# Patient Record
Sex: Female | Born: 1980 | Hispanic: No | Marital: Married | Smoking: Former smoker
Health system: Southern US, Community
[De-identification: ages and names within clinical notes are randomized; demographics above are authoritative.]

## PROBLEM LIST (undated history)

## (undated) DIAGNOSIS — R87619 Unspecified abnormal cytological findings in specimens from cervix uteri: Secondary | ICD-10-CM

## (undated) DIAGNOSIS — A749 Chlamydial infection, unspecified: Secondary | ICD-10-CM

## (undated) DIAGNOSIS — IMO0002 Reserved for concepts with insufficient information to code with codable children: Secondary | ICD-10-CM

## (undated) HISTORY — PX: BREAST SURGERY: SHX581

## (undated) HISTORY — PX: INDUCED ABORTION: SHX677

---

## 2010-03-07 NOTE — L&D Delivery Note (Signed)
Delivery Note At 2:45 AM a viable female was delivered via Vaginal, Spontaneous Delivery (Presentation: ; Occiput Anterior).  APGAR: 8, 10; weight 6 lb 12.8 oz (3085 g).   Placenta status: Intact, Spontaneous.  Cord: 3 vessels with the following complications: None.    Anesthesia: Epidural  Episiotomy: None Lacerations: 2nd degree;Perineal Suture Repair: 2.0 3.0 vicryl rapide Est. Blood Loss (mL): 500  Mom to postpartum.  Baby to nursery-stable.  JACKSON-MOORE,Reymond Maynez A 10/03/2010, 3:14 AM

## 2010-06-13 ENCOUNTER — Inpatient Hospital Stay (HOSPITAL_COMMUNITY)
Admission: AD | Admit: 2010-06-13 | Discharge: 2010-06-13 | Disposition: A | Discharge status: Home / Self Care | Payer: BC Managed Care – HMO | Source: Ambulatory Visit | Attending: Obstetrics & Gynecology | Admitting: Obstetrics & Gynecology

## 2010-06-13 DIAGNOSIS — M549 Dorsalgia, unspecified: Secondary | ICD-10-CM

## 2010-06-13 DIAGNOSIS — O99891 Other specified diseases and conditions complicating pregnancy: Secondary | ICD-10-CM | POA: Insufficient documentation

## 2010-06-13 DIAGNOSIS — O9989 Other specified diseases and conditions complicating pregnancy, childbirth and the puerperium: Secondary | ICD-10-CM

## 2010-06-13 LAB — URINALYSIS, ROUTINE W REFLEX MICROSCOPIC
Bilirubin Urine: NEGATIVE
Ketones, ur: NEGATIVE mg/dL
Nitrite: NEGATIVE
Specific Gravity, Urine: 1.02 (ref 1.005–1.030)
Urobilinogen, UA: 0.2 mg/dL (ref 0.0–1.0)

## 2010-07-29 LAB — HIV ANTIBODY (ROUTINE TESTING W REFLEX)
HIV: NONREACTIVE
HIV: NONREACTIVE

## 2010-07-29 LAB — ABO/RH: RH Type: POSITIVE

## 2010-07-29 LAB — HEPATITIS B SURFACE ANTIGEN: Hepatitis B Surface Ag: NEGATIVE

## 2010-07-29 LAB — RPR
RPR: NONREACTIVE
RPR: NONREACTIVE

## 2010-07-29 LAB — TYPE AND SCREEN: Antibody Screen: NEGATIVE

## 2010-10-02 ENCOUNTER — Inpatient Hospital Stay (HOSPITAL_COMMUNITY): Payer: BC Managed Care – HMO | Admitting: Anesthesiology

## 2010-10-02 ENCOUNTER — Encounter (HOSPITAL_COMMUNITY): Payer: Self-pay | Admitting: Anesthesiology

## 2010-10-02 ENCOUNTER — Inpatient Hospital Stay (HOSPITAL_COMMUNITY)
Admission: AD | Admit: 2010-10-02 | Discharge: 2010-10-04 | DRG: 372 | Disposition: A | Discharge status: Home / Self Care | Payer: BC Managed Care – HMO | Source: Ambulatory Visit | Attending: Obstetrics & Gynecology | Admitting: Obstetrics & Gynecology

## 2010-10-02 ENCOUNTER — Encounter (HOSPITAL_COMMUNITY): Payer: Self-pay | Admitting: Obstetrics and Gynecology

## 2010-10-02 DIAGNOSIS — O139 Gestational [pregnancy-induced] hypertension without significant proteinuria, unspecified trimester: Secondary | ICD-10-CM | POA: Diagnosis not present

## 2010-10-02 DIAGNOSIS — O4290 Premature rupture of membranes, unspecified as to length of time between rupture and onset of labor, unspecified weeks of gestation: Secondary | ICD-10-CM | POA: Diagnosis present

## 2010-10-02 DIAGNOSIS — IMO0002 Reserved for concepts with insufficient information to code with codable children: Secondary | ICD-10-CM | POA: Diagnosis present

## 2010-10-02 DIAGNOSIS — O4292 Full-term premature rupture of membranes, unspecified as to length of time between rupture and onset of labor: Secondary | ICD-10-CM

## 2010-10-02 LAB — POCT FERN TEST: Fern Test: POSITIVE

## 2010-10-02 LAB — CBC
Hemoglobin: 11.5 g/dL — ABNORMAL LOW (ref 12.0–15.0)
MCH: 29 pg (ref 26.0–34.0)
Platelets: 277 10*3/uL (ref 150–400)
RBC: 3.96 MIL/uL (ref 3.87–5.11)
WBC: 8.9 10*3/uL (ref 4.0–10.5)

## 2010-10-02 LAB — RPR: RPR Ser Ql: NONREACTIVE

## 2010-10-02 MED ORDER — EPHEDRINE 5 MG/ML INJ
10.0000 mg | INTRAVENOUS | Status: DC | PRN
  Filled 2010-10-02: qty 4

## 2010-10-02 MED ORDER — DIPHENHYDRAMINE HCL 50 MG/ML IJ SOLN: 12.5000 mg | INTRAMUSCULAR | Status: DC | PRN

## 2010-10-02 MED ORDER — FLEET ENEMA 7-19 GM/118ML RE ENEM: 1.0000 | ENEMA | RECTAL | Status: DC | PRN

## 2010-10-02 MED ORDER — FENTANYL 2.5 MCG/ML BUPIVACAINE 1/10 % EPIDURAL INFUSION (WH - ANES)
14.0000 mL/h | INTRAMUSCULAR | Status: DC
  Administered 2010-10-02 – 2010-10-03 (×4): 14 mL/h via EPIDURAL
  Filled 2010-10-02 (×4): qty 60

## 2010-10-02 MED ORDER — OXYTOCIN 20 UNITS IN LACTATED RINGERS INFUSION - SIMPLE
1.0000 m[IU]/min | INTRAVENOUS | Status: DC
  Administered 2010-10-02: 2 m[IU]/min via INTRAVENOUS
  Filled 2010-10-02: qty 1000

## 2010-10-02 MED ORDER — LACTATED RINGERS IV SOLN
500.0000 mL | Freq: Once | INTRAVENOUS | Status: AC
  Administered 2010-10-02: 500 mL via INTRAVENOUS

## 2010-10-02 MED ORDER — OXYTOCIN 20 UNITS IN LACTATED RINGERS INFUSION - SIMPLE
125.0000 mL/h | Freq: Once | INTRAVENOUS | Status: DC
  Administered 2010-10-03: 999 mL/h via INTRAVENOUS

## 2010-10-02 MED ORDER — LACTATED RINGERS IV SOLN: 500.0000 mL | INTRAVENOUS | Status: DC | PRN

## 2010-10-02 MED ORDER — PHENYLEPHRINE 40 MCG/ML (10ML) SYRINGE FOR IV PUSH (FOR BLOOD PRESSURE SUPPORT)
80.0000 ug | PREFILLED_SYRINGE | INTRAVENOUS | Status: DC | PRN
  Filled 2010-10-02 (×2): qty 5

## 2010-10-02 MED ORDER — LORATADINE 10 MG PO TABS
10.0000 mg | ORAL_TABLET | Freq: Every day | ORAL | Status: DC
  Administered 2010-10-02: 10 mg via ORAL
  Filled 2010-10-02 (×2): qty 1

## 2010-10-02 MED ORDER — ACETAMINOPHEN 325 MG PO TABS: 650.0000 mg | ORAL_TABLET | ORAL | Status: DC | PRN

## 2010-10-02 MED ORDER — OXYCODONE-ACETAMINOPHEN 5-325 MG PO TABS: 2.0000 | ORAL_TABLET | ORAL | Status: DC | PRN

## 2010-10-02 MED ORDER — LIDOCAINE HCL (PF) 1 % IJ SOLN
30.0000 mL | INTRAMUSCULAR | Status: DC | PRN
  Administered 2010-10-03: 30 mL via SUBCUTANEOUS
  Filled 2010-10-02: qty 30

## 2010-10-02 MED ORDER — CITRIC ACID-SODIUM CITRATE 334-500 MG/5ML PO SOLN: 30.0000 mL | ORAL | Status: DC | PRN

## 2010-10-02 MED ORDER — IBUPROFEN 600 MG PO TABS: 600.0000 mg | ORAL_TABLET | Freq: Four times a day (QID) | ORAL | Status: DC | PRN

## 2010-10-02 MED ORDER — TERBUTALINE SULFATE 1 MG/ML IJ SOLN: 0.2500 mg | Freq: Once | INTRAMUSCULAR | Status: AC | PRN

## 2010-10-02 MED ORDER — ONDANSETRON HCL 4 MG/2ML IJ SOLN: 4.0000 mg | Freq: Four times a day (QID) | INTRAMUSCULAR | Status: DC | PRN

## 2010-10-02 MED ORDER — EPHEDRINE 5 MG/ML INJ
10.0000 mg | INTRAVENOUS | Status: DC | PRN
  Filled 2010-10-02 (×2): qty 4

## 2010-10-02 MED ORDER — NALBUPHINE HCL 10 MG/ML IJ SOLN
10.0000 mg | INTRAMUSCULAR | Status: DC | PRN
  Filled 2010-10-02: qty 1

## 2010-10-02 MED ORDER — PHENYLEPHRINE 40 MCG/ML (10ML) SYRINGE FOR IV PUSH (FOR BLOOD PRESSURE SUPPORT)
80.0000 ug | PREFILLED_SYRINGE | INTRAVENOUS | Status: DC | PRN
  Filled 2010-10-02: qty 5

## 2010-10-02 MED ORDER — LACTATED RINGERS IV SOLN
INTRAVENOUS | Status: DC
  Administered 2010-10-02 – 2010-10-03 (×3): via INTRAVENOUS

## 2010-10-02 MED ORDER — OXYTOCIN 20 UNITS IN LACTATED RINGERS INFUSION - SIMPLE
1.0000 m[IU]/min | INTRAVENOUS | Status: DC
  Filled 2010-10-02: qty 1000

## 2010-10-02 NOTE — Anesthesia Preprocedure Evaluation (Signed)
Anesthesia Evaluation  Name, MR# and DOB Patient awake  General Assessment Comment  Reviewed: Allergy & Precautions, H&P  and Patient's Chart, lab work & pertinent test results  Airway Mallampati: II TM Distance: >3 FB Neck ROM: full    Dental No notable dental hx    Pulmonaryneg pulmonary ROS    clear to auscultation  pulmonary exam normal   Cardiovascular regular Normal   Neuro/PsychNegative Neurological ROS Negative Psych ROS  GI/Hepatic/Renal negative GI ROS, negative Liver ROS, and negative Renal ROS (+)       Endo/Other  Negative Endocrine ROS (+)   Abdominal   Musculoskeletal  Hematology negative hematology ROS (+)   Peds  Reproductive/Obstetrics (+) Pregnancy   Anesthesia Other Findings             Anesthesia Physical Anesthesia Plan  ASA: II  Anesthesia Plan: Epidural   Post-op Pain Management:    Induction:   Airway Management Planned:   Additional Equipment:   Intra-op Plan:   Post-operative Plan:   Informed Consent: I have reviewed the patients History and Physical, chart, labs and discussed the procedure including the risks, benefits and alternatives for the proposed anesthesia with the patient or authorized representative who has indicated his/her understanding and acceptance.     Plan Discussed with:   Anesthesia Plan Comments:         Anesthesia Quick Evaluation  

## 2010-10-02 NOTE — ED Provider Notes (Signed)
History     Chief Complaint  Patient presents with  . Vaginal Discharge    "I think my water broke"   HPI Called to do speculum exam to R/O rupture of membranes. Also has mild contractions. +fetal movement.   No past medical history on file.  Past Surgical History  Procedure Date  . Breast surgery 3 -4 yrs ago    augmentation to increase size    No family history on file.  History  Substance Use Topics  . Smoking status: Never Smoker   . Smokeless tobacco: Not on file  . Alcohol Use: No    Allergies:  Allergies  Allergen Reactions  . Latex Itching and Swelling    Prescriptions prior to admission  Medication Sig Dispense Refill  . cetirizine (ZYRTEC) 10 MG tablet Take 10 mg by mouth daily.        . prenatal vitamin w/FE, FA (PRENATAL 1 + 1) 27-1 MG TABS Take 1 tablet by mouth daily.        . ranitidine (ZANTAC) 150 MG tablet Take 150 mg by mouth 3 (three) times daily as needed.          Review of Systems  Gastrointestinal: Negative for abdominal pain.  Genitourinary:       Watery discharge    Physical Exam   Blood pressure 134/89, pulse 88, temperature 98.6 F (37 C), temperature source Oral, resp. rate 20, height 5\' 3"  (1.6 m), weight 181 lb (82.101 kg).  Physical Exam  Constitutional: She is oriented to person, place, and time. She appears well-developed and well-nourished.  HENT:  Head: Normocephalic.  Respiratory: Effort normal.  GI: Soft. There is no tenderness.  Genitourinary: Uterus normal. Vaginal discharge (Speculum: copious pooling clear fluid. Cervix 1-2/80/-3/vtx) found.  Musculoskeletal: Normal range of motion.  Neurological: She is alert and oriented to person, place, and time.  Skin: Skin is warm and dry.  Psychiatric: She has a normal mood and affect.    MAU Course  Procedures  MDM   Assessment and Plan  IUP at term PROM at term Admit per Dr Raynelle Jan 10/02/2010, 7:01 AM

## 2010-10-02 NOTE — Progress Notes (Signed)
"  I woke up wet, because I thought I had to use the BR at 0440.  I have continued to leak clear fluid since then and now I'm bleeding."

## 2010-10-02 NOTE — H&P (Signed)
Wendy Austin is a 30 y.o. female presenting for SROM. Maternal Medical History:  Reason for admission: Reason for admission: rupture of membranes.  Reason for Admission:   nauseaFetal activity: Perceived fetal activity is normal.   Last perceived fetal movement was within the past hour.    Prenatal complications: no prenatal complications   OB History    Grav Para Term Preterm Abortions TAB SAB Ect Mult Living   2 0 0 0 1 1 0 0 0 0      History reviewed. No pertinent past medical history. Past Surgical History  Procedure Date  . Breast surgery 3 -4 yrs ago    augmentation to increase size   Family History: family history is not on file. Social History:  reports that she has never smoked. She does not have any smokeless tobacco history on file. She reports that she does not drink alcohol or use illicit drugs.  Review of Systems  Constitutional: Negative for fever.  Eyes: Negative for blurred vision.  Respiratory: Negative for shortness of breath.   Gastrointestinal: Negative for nausea and vomiting.  Skin: Negative for rash.  Neurological: Negative for headaches.    Dilation: 2.5 Effacement (%): 80 Station: -2 Exam by:: k fields, rn Blood pressure 145/93, pulse 82, temperature 98.5 F (36.9 C), temperature source Oral, resp. rate 18, height 5\' 3"  (1.6 m), weight 82.101 kg (181 lb), last menstrual period 01/11/2010, unknown if currently breastfeeding. Maternal Exam:  Uterine Assessment: Contraction strength is mild.  Contraction frequency is irregular.   Abdomen: Patient reports no abdominal tenderness. Fetal presentation: vertex  Introitus: not evaluated.     Fetal Exam Fetal Monitor Review: Variability: moderate (6-25 bpm).   Pattern: accelerations present and no decelerations.    Fetal State Assessment: Category I - tracings are normal.     Physical Exam  Constitutional: She appears well-developed.  HENT:  Head: Normocephalic.  Neck: Neck supple. No  thyromegaly present.  Cardiovascular: Normal rate and regular rhythm.   Respiratory: Breath sounds normal.  GI: Soft. Bowel sounds are normal.  Skin: No rash noted.    Prenatal labs: ABO, Rh:   Antibody: Negative (05/24 0000) Rubella:   RPR: Nonreactive, Nonreactive, Nonreactive (05/24 0000)  HBsAg: Negative (05/24 0000)  HIV: Non-reactive, Non-reactive, Non-reactive (05/24 0000)  GBS: Negative (07/19 0000)   Assessment/Plan: 30 y.o. @ [redacted]w[redacted]d SROM, prodromal labor.  Admit.  Possible augmentation of labor with Pitocin.  Anticipate and NSVD.  JACKSON-MOORE,Doak Mah A 10/02/2010, 2:24 PM

## 2010-10-02 NOTE — Anesthesia Procedure Notes (Signed)
Epidural Patient location during procedure: OB Start time: 10/02/2010 2:23 PM  Staffing Anesthesiologist: Brayton Caves R Performed by: anesthesiologist   Preanesthetic Checklist Completed: patient identified, site marked, surgical consent, pre-op evaluation, timeout performed, IV checked, risks and benefits discussed and monitors and equipment checked  Epidural Patient position: sitting Prep: site prepped and draped and DuraPrep Patient monitoring: continuous pulse ox and blood pressure Approach: midline Injection technique: LOR saline  Needle:  Needle type: Tuohy  Needle gauge: 17 G Needle length: 9 cm Needle insertion depth: 5 cm cm Catheter type: closed end flexible Catheter size: 19 Gauge Catheter at skin depth: 10 cm Test dose: negative  Assessment Events: blood not aspirated, injection not painful, no injection resistance, negative IV test and no paresthesia  Additional Notes Patient identified.  Risk benefits discussed including failed block, incomplete pain control, headache, nerve damage, paralysis, blood pressure changes, nausea, vomiting, reactions to medication both toxic or allergic, and postpartum back pain.  Patient expressed understanding and wished to proceed.  All questions were answered.  Sterile technique used throughout procedure and epidural site dressed with sterile barrier dressing. No paresthesia or other complications noted. 5 minutes prior to starting epidural infusion of fentanyl and bupivicaine, the epidural was loaded with 9 ml of 1.5% lidocaine in three separate doses.  The patient did not experience any signs of intravascular injection such as tinnitus or metallic taste in mouth nor signs of intrathecal spread such as rapid motor block. Please see nursing notes for vital signs.

## 2010-10-02 NOTE — Progress Notes (Signed)
Glendola Friedhoff is a 30 y.o. G2P0010 at [redacted]w[redacted]d by LMP admitted for rupture of membranes  Subjective:   Objective: BP 134/83  Pulse 89  Temp(Src) 98.7 F (37.1 C) (Oral)  Resp 18  Ht 5\' 3"  (1.6 m)  Wt 82.101 kg (181 lb)  BMI 32.06 kg/m2  SpO2 100%  LMP 01/11/2010  Breastfeeding? Unknown      FHT:  FHR: 140 bpm, variability: moderate,  accelerations:  Present,  decelerations:  Absent UC:   regular, every 2 minutes SVE:   Dilation: 7.5 Effacement (%): 80 Station: 0 Exam by:: Ferne Coe RN  Labs: Lab Results  Component Value Date   WBC 8.9 10/02/2010   HGB 11.5* 10/02/2010   HCT 34.3* 10/02/2010   MCV 86.6 10/02/2010   PLT 277 10/02/2010    Assessment / Plan: Augmentation of labor, progressing well  Labor: Progressing normally Preeclampsia:  check labs Fetal Wellbeing:  Category I Pain Control:  Epidural I/D:  n/a Anticipated MOD:  NSVD  JACKSON-MOORE,Chauntae Hults A 10/02/2010, 11:41 PM

## 2010-10-03 ENCOUNTER — Encounter (HOSPITAL_COMMUNITY): Payer: Self-pay | Admitting: *Deleted

## 2010-10-03 LAB — LACTATE DEHYDROGENASE: LDH: 216 U/L (ref 94–250)

## 2010-10-03 LAB — URIC ACID: Uric Acid, Serum: 6.5 mg/dL (ref 2.4–7.0)

## 2010-10-03 LAB — CBC
HCT: 32.1 % — ABNORMAL LOW (ref 36.0–46.0)
Hemoglobin: 10.7 g/dL — ABNORMAL LOW (ref 12.0–15.0)
MCH: 28.8 pg (ref 26.0–34.0)
MCHC: 33.3 g/dL (ref 30.0–36.0)
MCV: 86.3 fL (ref 78.0–100.0)
Platelets: 264 10*3/uL (ref 150–400)
RBC: 3.72 MIL/uL — ABNORMAL LOW (ref 3.87–5.11)
RDW: 13.6 % (ref 11.5–15.5)
WBC: 21.6 10*3/uL — ABNORMAL HIGH (ref 4.0–10.5)

## 2010-10-03 LAB — COMPREHENSIVE METABOLIC PANEL
ALT: 8 U/L (ref 0–35)
AST: 18 U/L (ref 0–37)
Albumin: 2.2 g/dL — ABNORMAL LOW (ref 3.5–5.2)
Alkaline Phosphatase: 124 U/L — ABNORMAL HIGH (ref 39–117)
BUN: 9 mg/dL (ref 6–23)
CO2: 21 mEq/L (ref 19–32)
Calcium: 8.9 mg/dL (ref 8.4–10.5)
Chloride: 102 mEq/L (ref 96–112)
Creatinine, Ser: 0.7 mg/dL (ref 0.50–1.10)
GFR calc Af Amer: >60 mL/min (ref >60)
GFR calc non Af Amer: >60 mL/min (ref >60)
Glucose, Bld: 164 mg/dL — ABNORMAL HIGH (ref 70–99)
Potassium: 3.6 mEq/L (ref 3.5–5.1)
Sodium: 133 mEq/L — ABNORMAL LOW (ref 135–145)
Total Bilirubin: 0.5 mg/dL (ref 0.3–1.2)
Total Protein: 5.4 g/dL — ABNORMAL LOW (ref 6.0–8.3)

## 2010-10-03 MED ORDER — IBUPROFEN 600 MG PO TABS
600.0000 mg | ORAL_TABLET | Freq: Four times a day (QID) | ORAL | Status: DC
  Administered 2010-10-03 – 2010-10-04 (×5): 600 mg via ORAL
  Filled 2010-10-03 (×7): qty 1

## 2010-10-03 MED ORDER — PRENATAL PLUS 27-1 MG PO TABS
1.0000 | ORAL_TABLET | Freq: Every day | ORAL | Status: DC
  Administered 2010-10-03 – 2010-10-04 (×2): 1 via ORAL
  Filled 2010-10-03 (×2): qty 1

## 2010-10-03 MED ORDER — BENZOCAINE-MENTHOL 20-0.5 % EX AERO: 1.0000 "application " | INHALATION_SPRAY | CUTANEOUS | Status: DC | PRN

## 2010-10-03 MED ORDER — MAGNESIUM HYDROXIDE 400 MG/5ML PO SUSP: 30.0000 mL | ORAL | Status: DC | PRN

## 2010-10-03 MED ORDER — SODIUM CHLORIDE 0.9 % IJ SOLN: 3.0000 mL | INTRAMUSCULAR | Status: DC | PRN

## 2010-10-03 MED ORDER — SODIUM CHLORIDE 0.9 % IJ SOLN: 3.0000 mL | Freq: Two times a day (BID) | INTRAMUSCULAR | Status: DC

## 2010-10-03 MED ORDER — ONDANSETRON HCL 4 MG/2ML IJ SOLN: 4.0000 mg | INTRAMUSCULAR | Status: DC | PRN

## 2010-10-03 MED ORDER — TETANUS-DIPHTH-ACELL PERTUSSIS 5-2.5-18.5 LF-MCG/0.5 IM SUSP: 0.5000 mL | Freq: Once | INTRAMUSCULAR | Status: DC

## 2010-10-03 MED ORDER — ZOLPIDEM TARTRATE 5 MG PO TABS: 5.0000 mg | ORAL_TABLET | Freq: Every evening | ORAL | Status: DC | PRN

## 2010-10-03 MED ORDER — OXYCODONE-ACETAMINOPHEN 5-325 MG PO TABS
1.0000 | ORAL_TABLET | ORAL | Status: DC | PRN
  Administered 2010-10-04: 1 via ORAL
  Filled 2010-10-03: qty 1

## 2010-10-03 MED ORDER — ONDANSETRON HCL 4 MG PO TABS: 4.0000 mg | ORAL_TABLET | ORAL | Status: DC | PRN

## 2010-10-03 MED ORDER — MEASLES, MUMPS & RUBELLA VAC ~~LOC~~ INJ
0.5000 mL | INJECTION | Freq: Once | SUBCUTANEOUS | Status: DC
  Filled 2010-10-03: qty 0.5

## 2010-10-03 MED ORDER — FERROUS SULFATE 325 (65 FE) MG PO TABS
325.0000 mg | ORAL_TABLET | Freq: Two times a day (BID) | ORAL | Status: DC
  Administered 2010-10-03 – 2010-10-04 (×3): 325 mg via ORAL
  Filled 2010-10-03 (×3): qty 1

## 2010-10-03 MED ORDER — WITCH HAZEL-GLYCERIN EX PADS: MEDICATED_PAD | CUTANEOUS | Status: DC | PRN

## 2010-10-03 MED ORDER — MEDROXYPROGESTERONE ACETATE 150 MG/ML IM SUSP: 150.0000 mg | INTRAMUSCULAR | Status: DC | PRN

## 2010-10-03 MED ORDER — OXYTOCIN 10 UNIT/ML IJ SOLN
INTRAMUSCULAR | Status: AC
  Filled 2010-10-03: qty 2

## 2010-10-03 MED ORDER — LACTATED RINGERS IV SOLN
40.0000 [IU] | INTRAVENOUS | Status: DC
  Administered 2010-10-03: 40 [IU] via INTRAVENOUS
  Filled 2010-10-03: qty 4

## 2010-10-03 MED ORDER — SENNOSIDES-DOCUSATE SODIUM 8.6-50 MG PO TABS: 1.0000 | ORAL_TABLET | Freq: Every day | ORAL | Status: DC

## 2010-10-03 MED ORDER — OXYTOCIN 20 UNITS IN LACTATED RINGERS INFUSION - SIMPLE: 125.0000 mL/h | INTRAVENOUS | Status: DC | PRN

## 2010-10-03 MED ORDER — SODIUM CHLORIDE 0.9 % IV SOLN: 250.0000 mL | INTRAVENOUS | Status: DC

## 2010-10-03 MED ORDER — BENZOCAINE-MENTHOL 20-0.5 % EX AERO
INHALATION_SPRAY | CUTANEOUS | Status: AC
  Filled 2010-10-03: qty 56

## 2010-10-03 MED ORDER — LANOLIN HYDROUS EX OINT: TOPICAL_OINTMENT | CUTANEOUS | Status: DC | PRN

## 2010-10-03 NOTE — Anesthesia Postprocedure Evaluation (Signed)
  Anesthesia Post-op Note  Patient: Wendy Austin  Patient's cardiopulmonary status is stable Patient's level of consciousness: sedate but responsive verbally Pain and nausea are all reasonably controlled No anesthetic complications apparent at this time No follow up care necessary at this time

## 2010-10-03 NOTE — Progress Notes (Signed)
  Post Partum Day 0 S/P spontaneous vaginal RH status/Rubella reviewed.  Feeding: breast Subjective: No HA, SOB, CP, F/C, breast symptoms. Normal vaginal bleeding, no clots.     Objective: BP 135/95  Pulse 91  Temp(Src) 98.3 F (36.8 C) (Oral)  Resp 20  Ht 5\' 3"  (1.6 m)  Wt 82.101 kg (181 lb)  BMI 32.06 kg/m2  SpO2 100%  LMP 01/11/2010  Breastfeeding? Unknown   Physical Exam:  General: alert Lochia: appropriate Uterine Fundus: firm DVT Evaluation: No evidence of DVT seen on physical exam. Ext: No c/c/e  Basename 10/03/10 0520 10/02/10 0745  HGB 10.7* 11.5*  HCT 32.1* 34.3*      Assessment/Plan: 30 y.o.  PPD #0 .  normal postpartum exam PIH stable Continue current postpartum care  Ambulate   LOS: 1 day   JACKSON-MOORE,Rosie Torrez A 10/03/2010, 9:29 AM

## 2010-10-03 NOTE — Progress Notes (Signed)
Basic reviewed and mother states infant latching well. Offered to observe feeding and mother declined at this time.states she took private breastfeeding classes and that she is ok . inst to call if assistance is needed.

## 2010-10-04 MED ORDER — IBUPROFEN 600 MG PO TABS: 600.0000 mg | ORAL_TABLET | Freq: Four times a day (QID) | ORAL | Status: DC

## 2010-10-04 NOTE — Addendum Note (Signed)
Addendum  created 10/04/10 1121 by Randa Spike   Modules edited:Charges VN, Notes Section

## 2010-10-04 NOTE — Progress Notes (Signed)
  Post Partum Day 1 Subjective: no complaints, up ad lib, voiding and tolerating PO  Objective: Blood pressure 92/60, pulse 76, temperature 98.3 F (36.8 C), temperature source Oral, resp. rate 18, height 5\' 3"  (1.6 m), weight 82.101 kg (181 lb), last menstrual period 01/11/2010, SpO2 100.00%, unknown if currently breastfeeding.  Physical Exam:  General: alert and no distress Lochia: appropriate Uterine Fundus: firm Incision: n/a DVT Evaluation: No evidence of DVT seen on physical exam.   Basename 10/03/10 0520 10/02/10 0745  HGB 10.7* 11.5*  HCT 32.1* 34.3*    Assessment/Plan: Discharge home   LOS: 2 days   HARPER,CHARLES A 10/04/2010, 9:09 AM

## 2010-10-04 NOTE — Discharge Summary (Signed)
Obstetric Discharge Summary Reason for Admission: onset of labor Prenatal Procedures: ultrasound Intrapartum Procedures: spontaneous vaginal delivery Postpartum Procedures: none Complications-Operative and Postpartum: none Hemoglobin  Date Value Range Status  10/03/2010 10.7* 12.0-15.0 (g/dL) Final     HCT  Date Value Range Status  10/03/2010 32.1* 36.0-46.0 (%) Final    Discharge Diagnoses: Term Pregnancy-delivered  Discharge Information: Date: 10/04/2010 Activity: unrestricted Diet: routine Medications: PNV and Ibuprophen Condition: stable Instructions: refer to practice specific booklet Discharge to: home Follow-up Information    Follow up with Roseanna Rainbow, MD.   Contact information:   [address], [address] [place] [address] [phone]          Newborn Data: Live born female  Birth Weight: 6 lb 12.8 oz (3085 g) APGAR: 8, 10  Home with mother.  Salma Walrond A 10/04/2010, 9:11 AM

## 2010-10-04 NOTE — Addendum Note (Signed)
Addendum  created 10/04/10 1121 by Ruth Tully D Tanashia Ciesla   Modules edited:Charges VN, Notes Section    

## 2010-10-04 NOTE — Anesthesia Postprocedure Evaluation (Signed)
  Anesthesia Post-op Note  Patient: Wendy Austin  Procedure(s) Performed: * No procedures listed *  Patient Location: 145  Anesthesia Type: Epidural  Level of Consciousness: awake  Airway and Oxygen Therapy: Patient Spontanous Breathing    Post-op Assessment: Post-op Vital signs reviewed  Post-op Vital Signs: Reviewed  Complications: No apparent anesthesia complications

## 2010-10-04 NOTE — Progress Notes (Signed)
Charted on wrong pt- pt remains asleep/ no pain

## 2010-10-04 NOTE — Progress Notes (Signed)
Mother was discharged before seeing.

## 2011-08-22 ENCOUNTER — Other Ambulatory Visit: Payer: Self-pay | Admitting: Family Medicine

## 2011-08-22 DIAGNOSIS — E0590 Thyrotoxicosis, unspecified without thyrotoxic crisis or storm: Secondary | ICD-10-CM

## 2011-08-25 ENCOUNTER — Ambulatory Visit
Admission: RE | Admit: 2011-08-25 | Discharge: 2011-08-25 | Disposition: A | Discharge status: Home / Self Care | Payer: Medicaid Other | Source: Ambulatory Visit | Attending: Family Medicine | Admitting: Family Medicine

## 2011-08-25 DIAGNOSIS — E0590 Thyrotoxicosis, unspecified without thyrotoxic crisis or storm: Secondary | ICD-10-CM

## 2011-08-26 ENCOUNTER — Other Ambulatory Visit: Payer: BC Managed Care – HMO

## 2011-09-20 ENCOUNTER — Encounter (HOSPITAL_COMMUNITY): Payer: Self-pay | Admitting: *Deleted

## 2011-09-20 ENCOUNTER — Inpatient Hospital Stay (HOSPITAL_COMMUNITY): Payer: Medicaid Other

## 2011-09-20 ENCOUNTER — Inpatient Hospital Stay (HOSPITAL_COMMUNITY)
Admission: AD | Admit: 2011-09-20 | Discharge: 2011-09-20 | Disposition: A | Discharge status: Home / Self Care | Payer: Medicaid Other | Source: Ambulatory Visit | Attending: Obstetrics & Gynecology | Admitting: Obstetrics & Gynecology

## 2011-09-20 DIAGNOSIS — B9689 Other specified bacterial agents as the cause of diseases classified elsewhere: Secondary | ICD-10-CM | POA: Insufficient documentation

## 2011-09-20 DIAGNOSIS — M545 Low back pain, unspecified: Secondary | ICD-10-CM | POA: Insufficient documentation

## 2011-09-20 DIAGNOSIS — N760 Acute vaginitis: Secondary | ICD-10-CM | POA: Insufficient documentation

## 2011-09-20 DIAGNOSIS — O2390 Unspecified genitourinary tract infection in pregnancy, unspecified trimester: Secondary | ICD-10-CM | POA: Insufficient documentation

## 2011-09-20 DIAGNOSIS — A499 Bacterial infection, unspecified: Secondary | ICD-10-CM | POA: Insufficient documentation

## 2011-09-20 DIAGNOSIS — R109 Unspecified abdominal pain: Secondary | ICD-10-CM | POA: Insufficient documentation

## 2011-09-20 HISTORY — DX: Reserved for concepts with insufficient information to code with codable children: IMO0002

## 2011-09-20 HISTORY — DX: Chlamydial infection, unspecified: A74.9

## 2011-09-20 HISTORY — DX: Unspecified abnormal cytological findings in specimens from cervix uteri: R87.619

## 2011-09-20 LAB — CBC
HCT: 37.8 % (ref 36.0–46.0)
Hemoglobin: 12.9 g/dL (ref 12.0–15.0)
MCH: 29.9 pg (ref 26.0–34.0)
MCV: 87.5 fL (ref 78.0–100.0)
Platelets: 320 10*3/uL (ref 150–400)
RBC: 4.32 MIL/uL (ref 3.87–5.11)
WBC: 11.4 10*3/uL — ABNORMAL HIGH (ref 4.0–10.5)

## 2011-09-20 LAB — URINALYSIS, ROUTINE W REFLEX MICROSCOPIC
Bilirubin Urine: NEGATIVE
Glucose, UA: NEGATIVE mg/dL
Hgb urine dipstick: NEGATIVE
Ketones, ur: NEGATIVE mg/dL
Protein, ur: 30 mg/dL — AB
Urobilinogen, UA: 0.2 mg/dL (ref 0.0–1.0)

## 2011-09-20 LAB — POCT PREGNANCY, URINE: Preg Test, Ur: POSITIVE — AB

## 2011-09-20 LAB — WET PREP, GENITAL: Trich, Wet Prep: NONE SEEN

## 2011-09-20 LAB — URINE MICROSCOPIC-ADD ON

## 2011-09-20 MED ORDER — METRONIDAZOLE 500 MG PO TABS: 500.0000 mg | ORAL_TABLET | Freq: Two times a day (BID) | ORAL | Status: AC

## 2011-09-20 NOTE — MAU Note (Signed)
Patient states she has been having left low back pain and lower abdominal pain off and on for about 4 days. Has a positive pregnancy test at her primary MD. Denies any vomiting, some nausea. No bleeding but heavier white discharge.

## 2011-09-20 NOTE — MAU Provider Note (Signed)
  History     CSN: [id number]  Arrival date and time: 09/20/11 1533   None     Chief Complaint  Patient presents with  . Back Pain   HPI  Patient states she has been having left low back pain and lower abdominal pain off and on for about 4 days. Has a positive pregnancy test at her primary MD. Denies any vomiting, some nausea. No bleeding but heavier white discharge, no odor.     Past Medical History  Diagnosis Date  . Abnormal Pap smear     ok since  . Chlamydia     Past Surgical History  Procedure Date  . Breast surgery 3 -4 yrs ago    augmentation to increase size  . Induced abortion     Family History  Problem Relation Age of Onset  . Other Neg Hx     History  Substance Use Topics  . Smoking status: Former Smoker    Types: Cigarettes  . Smokeless tobacco: Not on file   Comment: 2009  . Alcohol Use: No    Allergies:  Allergies  Allergen Reactions  . Latex Itching and Swelling    Prescriptions prior to admission  Medication Sig Dispense Refill  . cetirizine (ZYRTEC) 10 MG tablet Take 10 mg by mouth daily.        Marland Kitchen ibuprofen (ADVIL,MOTRIN) 600 MG tablet Take 1 tablet (600 mg total) by mouth every 6 (six) hours.  30 tablet  5  . prenatal vitamin w/FE, FA (PRENATAL 1 + 1) 27-1 MG TABS Take 1 tablet by mouth daily.        . ranitidine (ZANTAC) 150 MG tablet Take 150 mg by mouth 3 (three) times daily as needed.          Review of Systems  Gastrointestinal: Positive for abdominal pain.  Genitourinary:       No vaginal bleeding  All other systems reviewed and are negative.   Physical Exam   Blood pressure 103/65, pulse 74, temperature 98.2 F (36.8 C), temperature source Oral, resp. rate 18, height 5\' 4"  (1.626 m), weight 55.52 kg (122 lb 6.4 oz), last menstrual period 07/25/2011, SpO2 100.00%, unknown if currently breastfeeding.  Physical Exam  Constitutional: She is oriented to person, place, and time. She appears well-developed and well-nourished.  No distress.  HENT:  Head: Normocephalic.  Neck: Normal range of motion. Neck supple.  Cardiovascular: Normal rate, regular rhythm and normal heart sounds.  Exam reveals no gallop and no friction rub.   No murmur heard. Respiratory: Effort normal and breath sounds normal. No respiratory distress.  GI: She exhibits no mass. There is no tenderness. There is no rebound, no guarding and no CVA tenderness.  Genitourinary: Uterus is enlarged. Cervix exhibits no motion tenderness and no discharge. Vaginal discharge (white, creamy) found.  Musculoskeletal: Normal range of motion.  Neurological: She is alert and oriented to person, place, and time.  Skin: Skin is warm and dry.  Psychiatric: She has a normal mood and affect.    MAU Course  Procedures   BHCG 40946 Wet prep - moderate clue GC/CT neg x2 Blood type O+ Hgb 12.9 Assessment and Plan  Bacterial Vaginosis  DC to home RX flagyl Begin prenatal care  [facility] 09/20/2011, 6:16 PM

## 2011-09-20 NOTE — MAU Note (Signed)
Negative CVA tenderness.

## 2011-09-20 NOTE — MAU Note (Signed)
Pain in low back on left side, comes and goes, getting worse.  Dull pain below umbilicus.

## 2011-09-21 LAB — GC/CHLAMYDIA PROBE AMP, GENITAL
Chlamydia, DNA Probe: NEGATIVE
GC Probe Amp, Genital: NEGATIVE

## 2014-01-06 ENCOUNTER — Encounter (HOSPITAL_COMMUNITY): Payer: Self-pay | Admitting: *Deleted

## 2015-11-04 ENCOUNTER — Emergency Department (HOSPITAL_COMMUNITY)
Admission: EM | Admit: 2015-11-04 | Discharge: 2015-11-05 | Disposition: A | Discharge status: Home / Self Care | Payer: BLUE CROSS/BLUE SHIELD | Attending: Emergency Medicine | Admitting: Emergency Medicine

## 2015-11-04 ENCOUNTER — Emergency Department (HOSPITAL_COMMUNITY): Payer: BLUE CROSS/BLUE SHIELD

## 2015-11-04 ENCOUNTER — Encounter (HOSPITAL_COMMUNITY): Payer: Self-pay

## 2015-11-04 DIAGNOSIS — R1031 Right lower quadrant pain: Secondary | ICD-10-CM | POA: Diagnosis not present

## 2015-11-04 DIAGNOSIS — R110 Nausea: Secondary | ICD-10-CM | POA: Diagnosis not present

## 2015-11-04 DIAGNOSIS — Z87891 Personal history of nicotine dependence: Secondary | ICD-10-CM | POA: Diagnosis not present

## 2015-11-04 LAB — COMPREHENSIVE METABOLIC PANEL
ALT: 21 U/L (ref 14–54)
AST: 22 U/L (ref 15–41)
Albumin: 4.3 g/dL (ref 3.5–5.0)
Alkaline Phosphatase: 46 U/L (ref 38–126)
Anion gap: 6 (ref 5–15)
BUN: 10 mg/dL (ref 6–20)
CO2: 22 mmol/L (ref 22–32)
Calcium: 8.9 mg/dL (ref 8.9–10.3)
Chloride: 106 mmol/L (ref 101–111)
Creatinine, Ser: 0.78 mg/dL (ref 0.44–1.00)
GFR calc Af Amer: >60 mL/min (ref >60)
GFR calc non Af Amer: >60 mL/min (ref >60)
Glucose, Bld: 109 mg/dL — ABNORMAL HIGH (ref 65–99)
Potassium: 3.2 mmol/L — ABNORMAL LOW (ref 3.5–5.1)
Sodium: 134 mmol/L — ABNORMAL LOW (ref 135–145)
Total Bilirubin: 0.6 mg/dL (ref 0.3–1.2)
Total Protein: 8 g/dL (ref 6.5–8.1)

## 2015-11-04 LAB — CBC
HCT: 43.3 % (ref 36.0–46.0)
Hemoglobin: 15.4 g/dL — ABNORMAL HIGH (ref 12.0–15.0)
MCH: 32 pg (ref 26.0–34.0)
MCHC: 35.6 g/dL (ref 30.0–36.0)
MCV: 89.8 fL (ref 78.0–100.0)
Platelets: 299 10*3/uL (ref 150–400)
RBC: 4.82 MIL/uL (ref 3.87–5.11)
RDW: 12.4 % (ref 11.5–15.5)
WBC: 6.6 10*3/uL (ref 4.0–10.5)

## 2015-11-04 LAB — URINE MICROSCOPIC-ADD ON: RBC / HPF: NONE SEEN RBC/hpf (ref 0–5)

## 2015-11-04 LAB — URINALYSIS, ROUTINE W REFLEX MICROSCOPIC
Bilirubin Urine: NEGATIVE
GLUCOSE, UA: NEGATIVE mg/dL
Ketones, ur: NEGATIVE mg/dL
Nitrite: NEGATIVE
PROTEIN: NEGATIVE mg/dL
Specific Gravity, Urine: 1.005 (ref 1.005–1.030)
pH: 6 (ref 5.0–8.0)

## 2015-11-04 LAB — POC URINE PREG, ED: PREG TEST UR: NEGATIVE

## 2015-11-04 LAB — LIPASE, BLOOD: LIPASE: 37 U/L (ref 11–51)

## 2015-11-04 MED ORDER — MORPHINE SULFATE (PF) 4 MG/ML IV SOLN
4.0000 mg | Freq: Once | INTRAVENOUS | Status: AC
  Administered 2015-11-04: 4 mg via INTRAVENOUS
  Filled 2015-11-04: qty 1

## 2015-11-04 MED ORDER — IOPAMIDOL (ISOVUE-300) INJECTION 61%
100.0000 mL | Freq: Once | INTRAVENOUS | Status: AC | PRN
  Administered 2015-11-04: 100 mL via INTRAVENOUS

## 2015-11-04 MED ORDER — SODIUM CHLORIDE 0.9 % IV BOLUS (SEPSIS)
500.0000 mL | Freq: Once | INTRAVENOUS | Status: AC
  Administered 2015-11-04: 500 mL via INTRAVENOUS

## 2015-11-04 MED ORDER — ONDANSETRON 4 MG PO TBDP
4.0000 mg | ORAL_TABLET | Freq: Once | ORAL | Status: AC | PRN
  Administered 2015-11-04: 4 mg via ORAL
  Filled 2015-11-04: qty 1

## 2015-11-04 MED ORDER — ONDANSETRON HCL 4 MG/2ML IJ SOLN
4.0000 mg | Freq: Once | INTRAMUSCULAR | Status: AC
  Administered 2015-11-04: 4 mg via INTRAVENOUS
  Filled 2015-11-04: qty 2

## 2015-11-04 MED ORDER — DIATRIZOATE MEGLUMINE & SODIUM 66-10 % PO SOLN: 15.0000 mL | Freq: Once | ORAL | Status: DC

## 2015-11-04 MED ORDER — ACETAMINOPHEN 325 MG PO TABS
650.0000 mg | ORAL_TABLET | Freq: Once | ORAL | Status: AC | PRN
  Administered 2015-11-04: 650 mg via ORAL
  Filled 2015-11-04: qty 2

## 2015-11-04 NOTE — ED Triage Notes (Signed)
Pt states that she started having RLQ abdominal pain yesterday and started experiencing fever and chills today. Pt also states that she is experiencing pain in her neck. Pt is able to touch her chin to her chest without difficulty. Pt also endorses nausea without vomiting. A&Ox4. Ambulatory.

## 2015-11-04 NOTE — Progress Notes (Signed)
Patient listed as having [employer] insurance without a pcp.  EDCM spoke to patient at bedside.  Patient confirms she has [employer] insurance.  She does not have Medicaid insurance.  Patient reports she has made an appointment to see Dr. Leilani AbleBetti Reese for tomorrow to establish care.  No further EDCM needs at this time.

## 2015-11-04 NOTE — ED Provider Notes (Signed)
[facility]-EMERGENCY DEPT Provider Note   CSN: [id number] Arrival date & time: 11/04/15  1937     History   Chief Complaint Chief Complaint  Patient presents with  . Nausea  . Abdominal Pain    HPI Wendy Austin is a 35 y.o. female.  The history is provided by the patient.  Abdominal Pain   This is a new problem. Associated symptoms include nausea and dysuria. Pertinent negatives include diarrhea, vomiting and headaches.  Patient presents with abdominal pain. Began yesterday somewhat diffusely and is now more in the lower abdomen. Has had fevers. Has had dysuria. Has now pain in her right lower abdomen and right flank. No vaginal bleeding or discharge. Seen in urgent care and sent here. She's had a decreased appetite. Has had nausea without vomiting. No diarrhea. The pain is dull. Worse with movements. No sick contacts.  Past Medical History:  Diagnosis Date  . Abnormal Pap smear    ok since  . Chlamydia     Patient Active Problem List   Diagnosis Date Noted  . Normal delivery 10/03/2010  . Spontaneous rupture of membranes 10/02/2010  . Gestational hypertension 10/02/2010    Past Surgical History:  Procedure Laterality Date  . BREAST SURGERY  3 -4 yrs ago   augmentation to increase size  . INDUCED ABORTION      OB History    Gravida Para Term Preterm AB Living   3 1 1  0 1 1   SAB TAB Ectopic Multiple Live Births   0 1 0 0 1       Home Medications    Prior to Admission medications   Medication Sig Start Date End Date Taking? Authorizing Provider  acetaminophen (TYLENOL) 500 MG tablet Take 1,000 mg by mouth every 6 (six) hours as needed for moderate pain.   Yes Historical Provider, MD  cetirizine (ZYRTEC) 10 MG tablet Take 10 mg by mouth daily.     Yes Historical Provider, MD  ondansetron (ZOFRAN-ODT) 8 MG disintegrating tablet Take 1 tablet (8 mg total) by mouth every 8 (eight) hours as needed for nausea or vomiting. 11/05/15   Benjiman Core, MD    Family  History Family History  Problem Relation Age of Onset  . Other Neg Hx     Social History Social History  Substance Use Topics  . Smoking status: Former Smoker    Types: Cigarettes  . Smokeless tobacco: Not on file     Comment: 2009  . Alcohol use No     Allergies   Latex   Review of Systems Review of Systems  Constitutional: Positive for appetite change.  Respiratory: Negative for shortness of breath.   Cardiovascular: Negative for chest pain.  Gastrointestinal: Positive for abdominal pain and nausea. Negative for diarrhea and vomiting.  Genitourinary: Positive for dysuria. Negative for flank pain.  Musculoskeletal: Negative for back pain.  Neurological: Negative for headaches.  Hematological: Negative for adenopathy.  Psychiatric/Behavioral: Negative for behavioral problems.     Physical Exam Updated Vital Signs BP 131/87 (BP Location: Right Arm)   Pulse 73   Temp 97.9 F (36.6 C) (Oral)   Resp 16   Ht 5\' 4"  (1.626 m)   Wt 145 lb (65.8 kg)   SpO2 99%   BMI 24.89 kg/m   Physical Exam  Constitutional: She appears well-developed and well-nourished.  HENT:  Head: Atraumatic.  Eyes: EOM are normal.  Neck: Neck supple.  Cardiovascular: Normal rate.   Abdominal: Soft. There is tenderness.  Genitourinary:  Genitourinary Comments: No CVA tenderness.  Neurological: She is alert.  Skin: Skin is warm.  Psychiatric: She has a normal mood and affect.     ED Treatments / Results  Labs (all labs ordered are listed, but only abnormal results are displayed) Labs Reviewed  COMPREHENSIVE METABOLIC PANEL - Abnormal; Notable for the following:       Result Value   Sodium 134 (*)    Potassium 3.2 (*)    Glucose, Bld 109 (*)    All other components within normal limits  CBC - Abnormal; Notable for the following:    Hemoglobin 15.4 (*)    All other components within normal limits  URINALYSIS, ROUTINE W REFLEX MICROSCOPIC (NOT [facility]) - Abnormal; Notable for the  following:    Hgb urine dipstick MODERATE (*)    Leukocytes, UA TRACE (*)    All other components within normal limits  URINE MICROSCOPIC-ADD ON - Abnormal; Notable for the following:    Squamous Epithelial / LPF 0-5 (*)    Bacteria, UA RARE (*)    All other components within normal limits  LIPASE, BLOOD  POC URINE PREG, ED    EKG  EKG Interpretation None       Radiology Ct Abdomen Pelvis W Contrast  Result Date: 11/04/2015 CLINICAL DATA:  Right lower quadrant abdominal pain, onset yesterday. Fever and chills today. EXAM: CT ABDOMEN AND PELVIS WITH CONTRAST TECHNIQUE: Multidetector CT imaging of the abdomen and pelvis was performed using the standard protocol following bolus administration of intravenous contrast. CONTRAST:  100mL ISOVUE-300 IOPAMIDOL (ISOVUE-300) INJECTION 61% COMPARISON:  None. FINDINGS: Lower chest:  No significant abnormality Hepatobiliary: There are normal appearances of the liver, gallbladder and bile ducts. Pancreas: Normal Spleen: Normal Adrenals/Urinary Tract: The adrenals and kidneys are normal in appearance. There is no urinary calculus evident. There is no hydronephrosis or ureteral dilatation. Collecting systems and ureters appear unremarkable. The urinary bladder is unremarkable. Stomach/Bowel: There are normal appearances of the stomach, small bowel and colon. The appendix is normal. Vascular/Lymphatic: The abdominal aorta is normal in caliber. There is no atherosclerotic calcification. There is no adenopathy in the abdomen or pelvis. Reproductive: Normal uterus and ovaries. Other: No focal inflammatory changes are evident in the abdomen or pelvis. Very small volume free pelvic fluid collected dependently in the cul-de-sac. Musculoskeletal: No significant skeletal lesions. IMPRESSION: No significant abnormality.  Normal appendix. Electronically Signed   By: Ellery Plunkaniel R Mitchell M.D.   On: 11/04/2015 23:53    Procedures Procedures (including critical care  time)  Medications Ordered in ED Medications  diatrizoate meglumine-sodium (GASTROGRAFIN) 66-10 % solution 15 mL (not administered)  acetaminophen (TYLENOL) tablet 650 mg (650 mg Oral Given 11/04/15 1959)  ondansetron (ZOFRAN-ODT) disintegrating tablet 4 mg (4 mg Oral Given 11/04/15 1959)  sodium chloride 0.9 % bolus 500 mL (0 mLs Intravenous Stopped 11/05/15 0039)  morphine 4 MG/ML injection 4 mg (4 mg Intravenous Given 11/04/15 2334)  ondansetron (ZOFRAN) injection 4 mg (4 mg Intravenous Given 11/04/15 2334)  iopamidol (ISOVUE-300) 61 % injection 100 mL (100 mLs Intravenous Contrast Given 11/04/15 2340)     Initial Impression / Assessment and Plan / ED Course  I have reviewed the triage vital signs and the nursing notes.  Pertinent labs & imaging results that were available during my care of the patient were reviewed by me and considered in my medical decision making (see chart for details).  Clinical Course    Patient with abdominal pain. Right lower quadrant.  Began more upper abdomen and localized right lower quadrant. Labs reassuring. No urinary tract infection not pregnant. CT scan done due to tenderness and did not show a cause. Doubt ovarian cause with the fever and fact started in the upper or diffuse abdomen. Will discharge home with antibiotics. Follow-up as needed.  Final Clinical Impressions(s) / ED Diagnoses   Final diagnoses:  Right lower quadrant abdominal pain  Nausea    New Prescriptions Discharge Medication List as of 11/05/2015 12:15 AM    START taking these medications   Details  ondansetron (ZOFRAN-ODT) 8 MG disintegrating tablet Take 1 tablet (8 mg total) by mouth every 8 (eight) hours as needed for nausea or vomiting., Starting Thu 11/05/2015, Print         Benjiman Core, MD 11/05/15 [phone]

## 2015-11-04 NOTE — ED Notes (Signed)
Pt was sent from urgent care for follow-up care. Pt reports frequency and dysuria with urinating. C/o pain 7/10 in lower abdomen. Tender to palpation.

## 2015-11-05 MED ORDER — ONDANSETRON 8 MG PO TBDP: 8.0000 mg | ORAL_TABLET | Freq: Three times a day (TID) | ORAL | 0 refills | Status: AC | PRN

## 2017-11-08 IMAGING — CT CT ABD-PELV W/ CM
2 of 4 series · 16 of 46 positions shown, 18 images · IV contrast (iopamidol)
Comparison: None.

CLINICAL DATA: Right lower quadrant abdominal pain, onset
yesterday. Fever and chills today.

EXAM:
CT ABDOMEN AND PELVIS WITH CONTRAST
TECHNIQUE: Multidetector CT imaging of the abdomen and pelvis was performed
using the standard protocol following bolus administration of
intravenous contrast.
CONTRAST:  100mL N8ABDQ-9CC IOPAMIDOL (N8ABDQ-9CC) INJECTION 61%

[Series 2: abd/pel with · axial · 0.77mm/px · z∈[+822,+1238]mm · 13 of 93 slices shown, 15 images]
[im 5/93  soft-tissue]
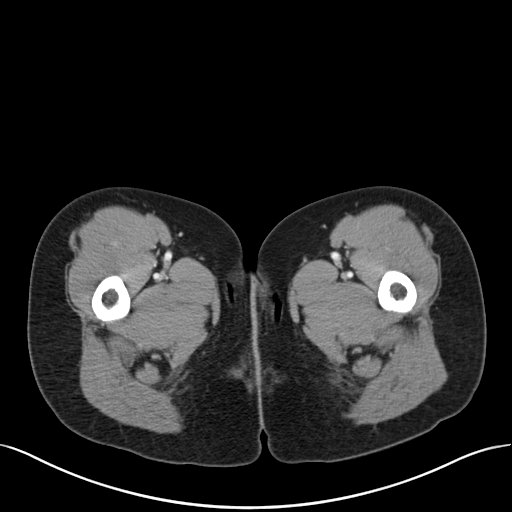
[im 5/93  bone]
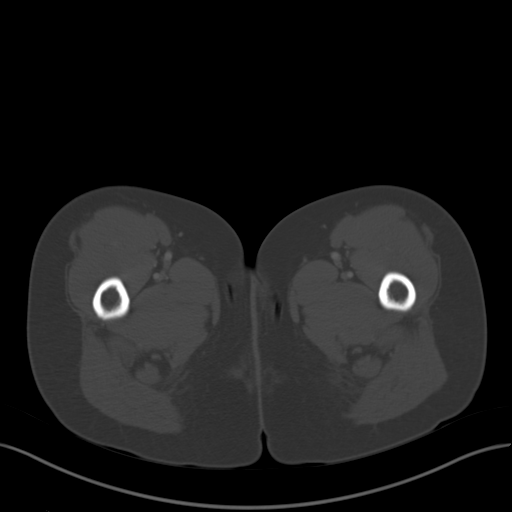
[im 14/93  soft-tissue]
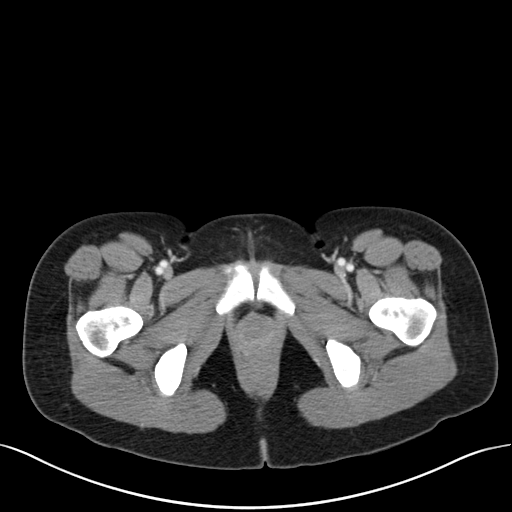
[im 18/93  soft-tissue]
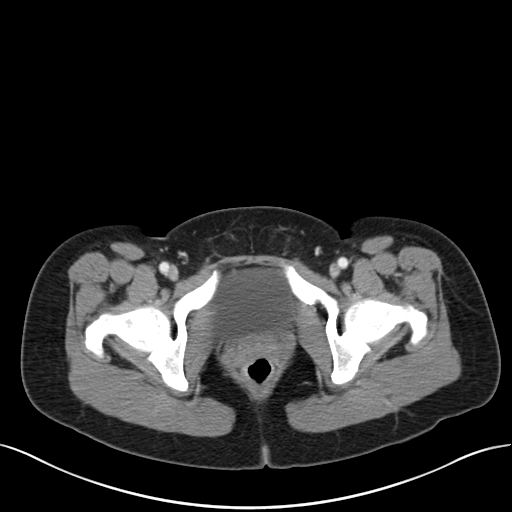
[im 27/93  soft-tissue]
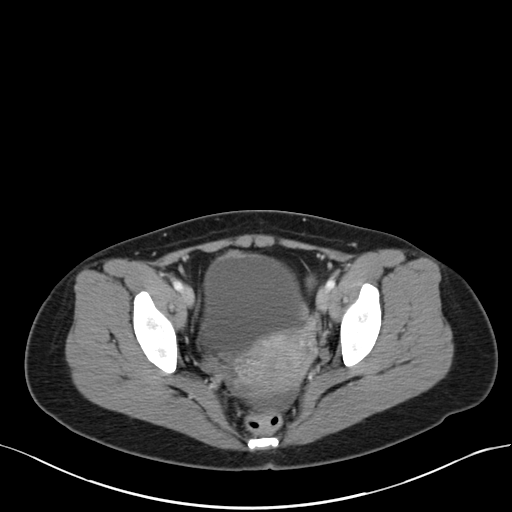
[im 31/93  soft-tissue]
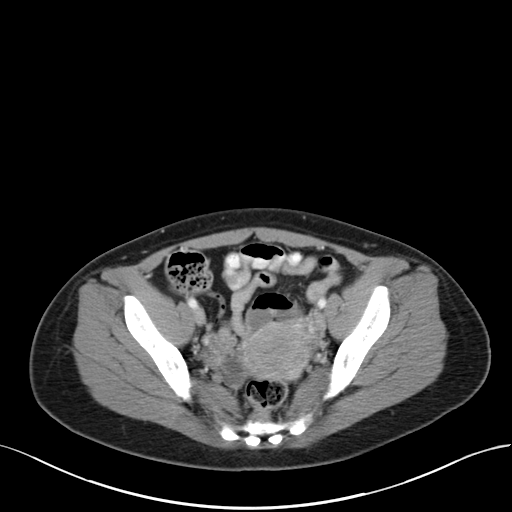
[im 40/93  soft-tissue]
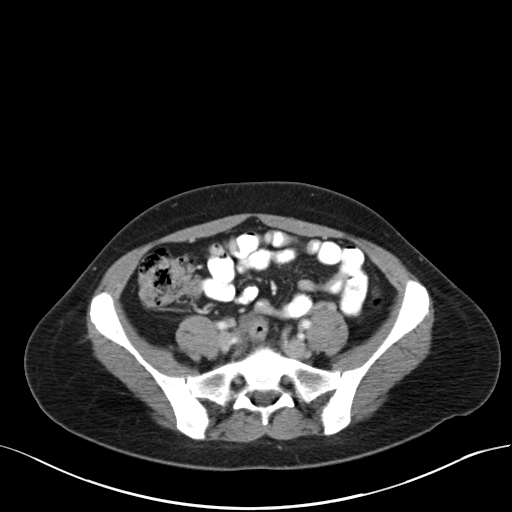
[im 49/93  soft-tissue]
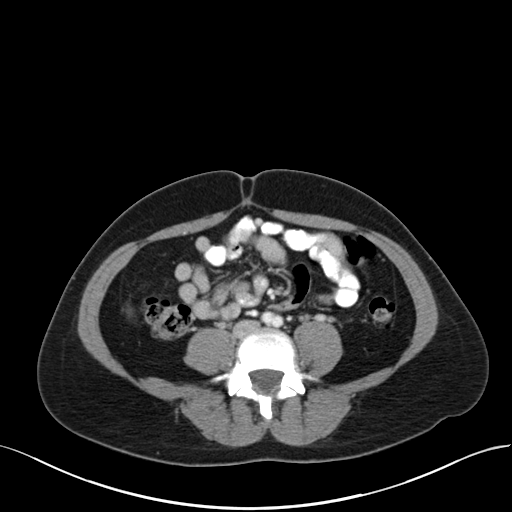
[im 53/93  soft-tissue]
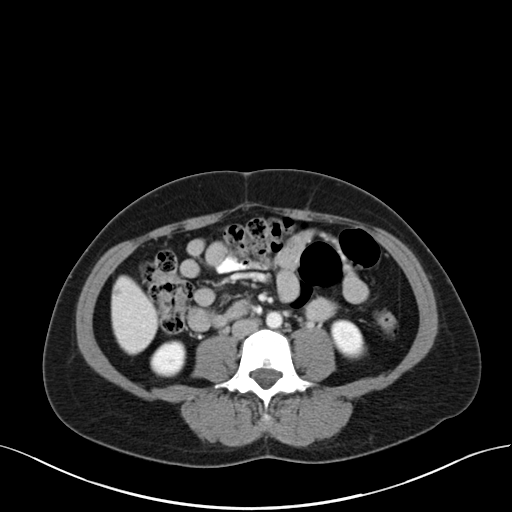
[im 62/93  soft-tissue]
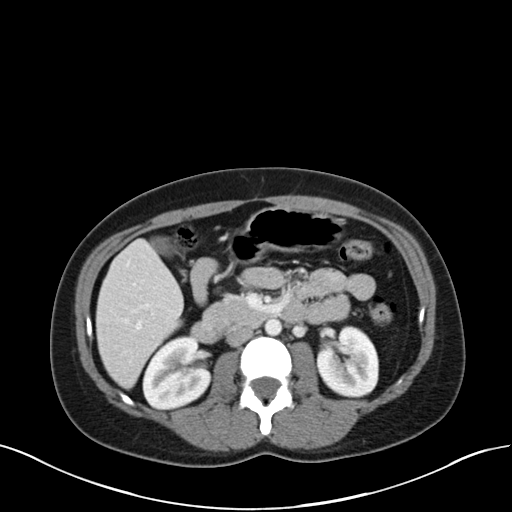
[im 62/93  bone]
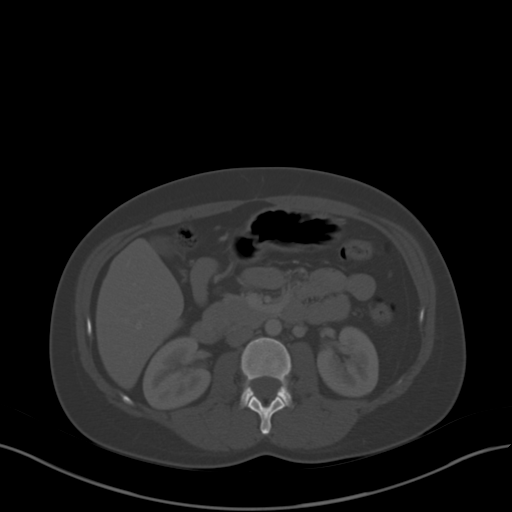
[im 66/93  soft-tissue]
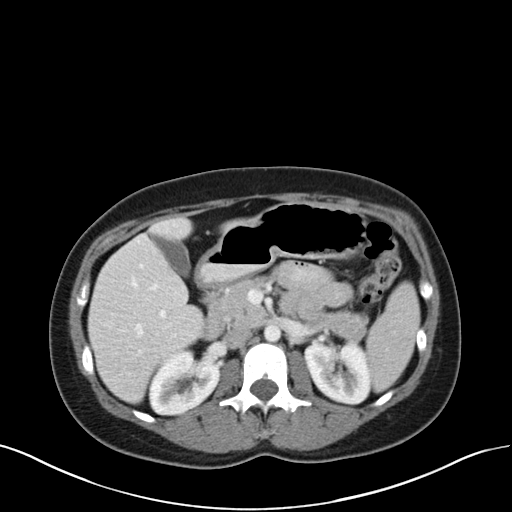
[im 75/93  soft-tissue]
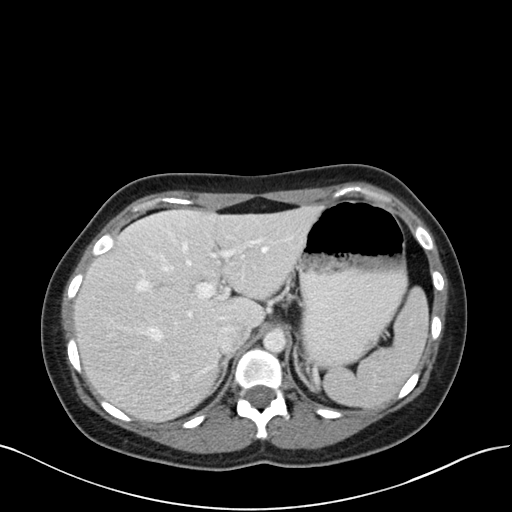
[im 79/93  soft-tissue]
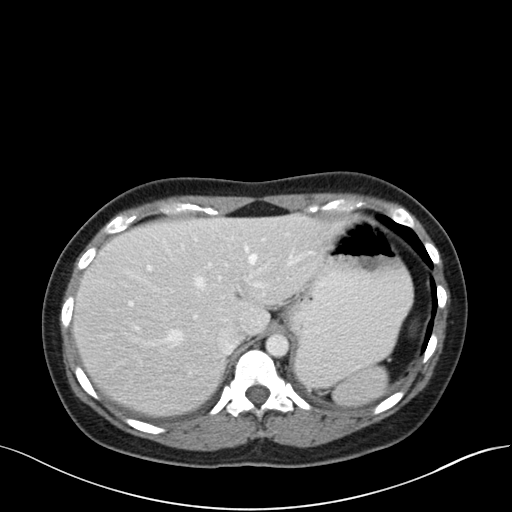
[im 88/93  soft-tissue]
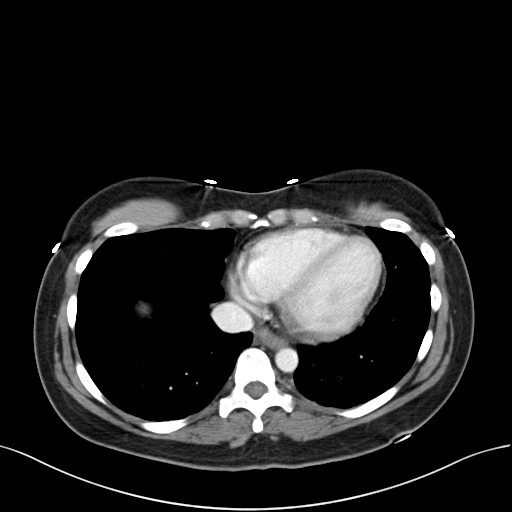

[Series 5: coronal a/|p · coronal · 0.72mm/px · 3 of 117 slices shown]
[im 39/117  soft-tissue]
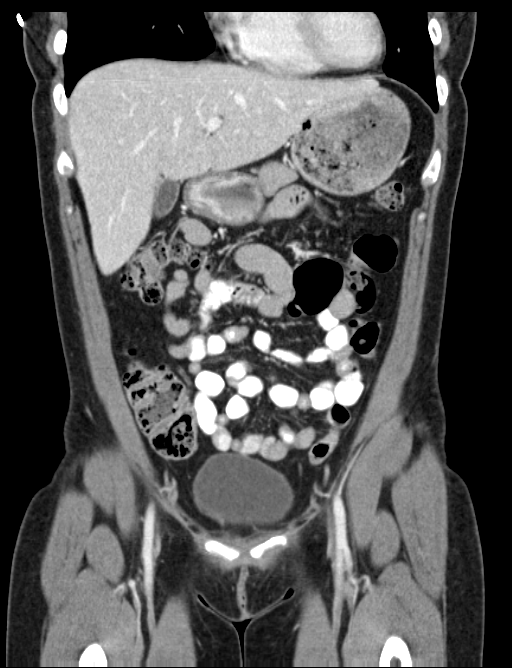
[im 52/117  soft-tissue]
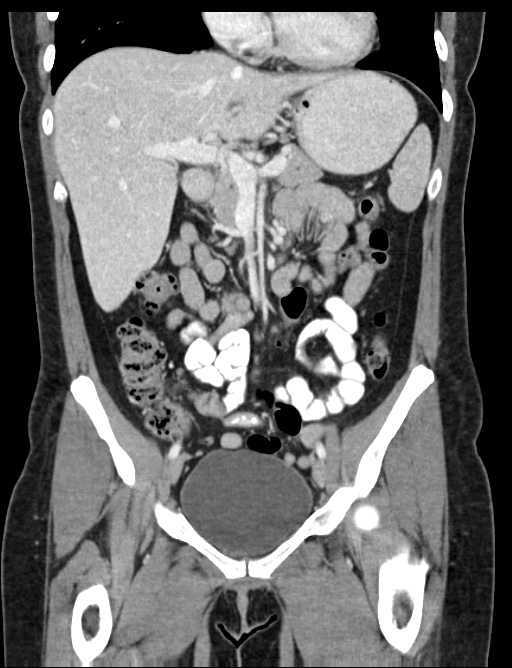
[im 65/117  soft-tissue]
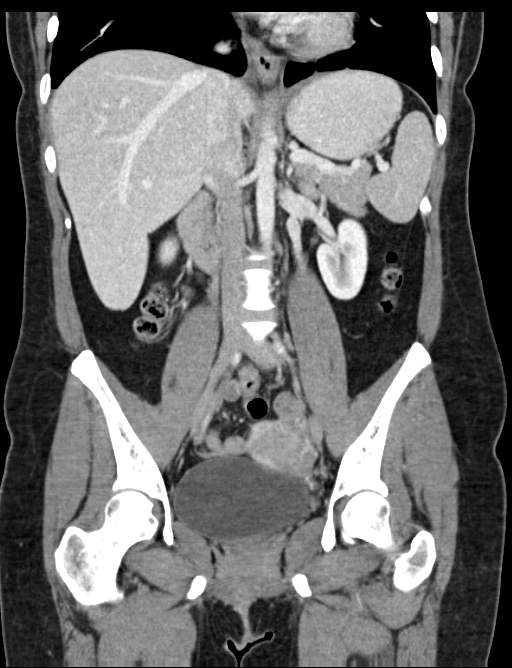

[16 of 46 positions shown; findings below may reference images not displayed]

FINDINGS: Lower chest:  No significant abnormality

Hepatobiliary: There are normal appearances of the liver,
gallbladder and bile ducts.

Pancreas: Normal

Spleen: Normal

Adrenals/Urinary Tract: The adrenals and kidneys are normal in
appearance. There is no urinary calculus evident. There is no
hydronephrosis or ureteral dilatation. Collecting systems and
ureters appear unremarkable. The urinary bladder is unremarkable.

Stomach/Bowel: There are normal appearances of the stomach, small
bowel and colon. The appendix is normal.

Vascular/Lymphatic: The abdominal aorta is normal in caliber. There
is no atherosclerotic calcification. There is no adenopathy in the
abdomen or pelvis.

Reproductive: Normal uterus and ovaries.

Other: No focal inflammatory changes are evident in the abdomen or
pelvis. Very small volume free pelvic fluid collected dependently in
the cul-de-sac.

Musculoskeletal: No significant skeletal lesions.
IMPRESSION: No significant abnormality.  Normal appendix.
